# Patient Record
Sex: Female | Born: 1985 | Race: White | Marital: Married | Smoking: Never smoker
Health system: Southern US, Community
[De-identification: ages and names within clinical notes are randomized; demographics above are authoritative.]

## PROBLEM LIST (undated history)

## (undated) DIAGNOSIS — R0683 Snoring: Secondary | ICD-10-CM

## (undated) DIAGNOSIS — I10 Essential (primary) hypertension: Secondary | ICD-10-CM

## (undated) HISTORY — DX: Essential (primary) hypertension: I10

## (undated) HISTORY — PX: CHOLECYSTECTOMY: SHX55

## (undated) HISTORY — DX: Snoring: R06.83

---

## 2013-10-24 ENCOUNTER — Ambulatory Visit: Payer: Self-pay | Admitting: Physician Assistant

## 2013-10-24 ENCOUNTER — Ambulatory Visit (HOSPITAL_BASED_OUTPATIENT_CLINIC_OR_DEPARTMENT_OTHER)
Admission: RE | Admit: 2013-10-24 | Discharge: 2013-10-24 | Disposition: A | Discharge status: Home / Self Care | Payer: Self-pay | Source: Ambulatory Visit | Attending: Physician Assistant | Admitting: Physician Assistant

## 2013-10-24 ENCOUNTER — Encounter: Payer: Self-pay | Admitting: Physician Assistant

## 2013-10-24 ENCOUNTER — Ambulatory Visit (INDEPENDENT_AMBULATORY_CARE_PROVIDER_SITE_OTHER): Payer: Self-pay | Admitting: Physician Assistant

## 2013-10-24 VITALS — BP 123/73 | HR 95 | Ht 64.0 in | Wt 185.0 lb

## 2013-10-24 DIAGNOSIS — Z87442 Personal history of urinary calculi: Secondary | ICD-10-CM | POA: Insufficient documentation

## 2013-10-24 DIAGNOSIS — R1012 Left upper quadrant pain: Secondary | ICD-10-CM

## 2013-10-24 DIAGNOSIS — K760 Fatty (change of) liver, not elsewhere classified: Secondary | ICD-10-CM | POA: Insufficient documentation

## 2013-10-24 DIAGNOSIS — Z13220 Encounter for screening for lipoid disorders: Secondary | ICD-10-CM

## 2013-10-24 DIAGNOSIS — Z131 Encounter for screening for diabetes mellitus: Secondary | ICD-10-CM

## 2013-10-24 DIAGNOSIS — E781 Pure hyperglyceridemia: Secondary | ICD-10-CM

## 2013-10-24 DIAGNOSIS — Z9089 Acquired absence of other organs: Secondary | ICD-10-CM | POA: Insufficient documentation

## 2013-10-24 DIAGNOSIS — E669 Obesity, unspecified: Secondary | ICD-10-CM | POA: Insufficient documentation

## 2013-10-24 DIAGNOSIS — Z8742 Personal history of other diseases of the female genital tract: Secondary | ICD-10-CM | POA: Insufficient documentation

## 2013-10-24 LAB — COMPLETE METABOLIC PANEL WITH GFR
ALK PHOS: 61 U/L (ref 39–117)
ALT: 18 U/L (ref 0–35)
AST: 18 U/L (ref 0–37)
Albumin: 4.3 g/dL (ref 3.5–5.2)
BILIRUBIN TOTAL: 0.4 mg/dL (ref 0.2–1.2)
BUN: 7 mg/dL (ref 6–23)
CALCIUM: 9.2 mg/dL (ref 8.4–10.5)
CHLORIDE: 106 meq/L (ref 96–112)
CO2: 26 mEq/L (ref 19–32)
CREATININE: 0.75 mg/dL (ref 0.50–1.10)
GFR, Est African American: >89 mL/min
GFR, Est Non African American: >89 mL/min
Glucose, Bld: 91 mg/dL (ref 70–99)
Potassium: 4.4 mEq/L (ref 3.5–5.3)
Sodium: 138 mEq/L (ref 135–145)
Total Protein: 6.5 g/dL (ref 6.0–8.3)

## 2013-10-24 LAB — LIPID PANEL
CHOL/HDL RATIO: 2.6 ratio
Cholesterol: 141 mg/dL (ref 0–200)
HDL: 55 mg/dL (ref >39)
LDL CALC: 71 mg/dL (ref 0–99)
TRIGLYCERIDES: 75 mg/dL (ref <150)
VLDL: 15 mg/dL (ref 0–40)

## 2013-10-24 LAB — CBC WITH DIFFERENTIAL/PLATELET
BASOS PCT: 1 % (ref 0–1)
Basophils Absolute: 0 10*3/uL (ref 0.0–0.1)
EOS ABS: 0 10*3/uL (ref 0.0–0.7)
EOS PCT: 1 % (ref 0–5)
HEMATOCRIT: 38.5 % (ref 36.0–46.0)
Hemoglobin: 13.5 g/dL (ref 12.0–15.0)
Lymphocytes Relative: 36 % (ref 12–46)
Lymphs Abs: 1.7 10*3/uL (ref 0.7–4.0)
MCH: 30.7 pg (ref 26.0–34.0)
MCHC: 35.1 g/dL (ref 30.0–36.0)
MCV: 87.5 fL (ref 78.0–100.0)
MONO ABS: 0.5 10*3/uL (ref 0.1–1.0)
Monocytes Relative: 11 % (ref 3–12)
Neutro Abs: 2.3 10*3/uL (ref 1.7–7.7)
Neutrophils Relative %: 51 % (ref 43–77)
Platelets: 277 10*3/uL (ref 150–400)
RBC: 4.4 MIL/uL (ref 3.87–5.11)
RDW: 13.2 % (ref 11.5–15.5)
WBC: 4.6 10*3/uL (ref 4.0–10.5)

## 2013-10-24 LAB — TSH: TSH: 0.749 u[IU]/mL (ref 0.350–4.500)

## 2013-10-24 LAB — AMYLASE: AMYLASE: 20 U/L (ref 0–105)

## 2013-10-24 LAB — LIPASE: LIPASE: 33 U/L (ref 0–75)

## 2013-10-24 NOTE — Patient Instructions (Signed)
Will call with results

## 2013-10-24 NOTE — Progress Notes (Signed)
   Subjective:    Patient ID: Jackie Harrington, female    DOB: 15-Jan-1986, 28 y.o.   MRN: [id number]  HPI Pt is a 28 yo female who presents to the clinic to establish care.   .. Active Ambulatory Problems    Diagnosis Date Noted  . Hypertriglyceridemia 10/24/2013  . History of kidney stones 10/24/2013  . History of ovarian cyst 10/24/2013   Resolved Ambulatory Problems    Diagnosis Date Noted  . No Resolved Ambulatory Problems   No Additional Past Medical History   .Marland Kitchen Family History  Problem Relation Age of Onset  . Hypertension Father   . Cancer Maternal Grandmother     vulvar   .Marland Kitchen History   Social History  . Marital Status: Married    Spouse Name: N/A    Number of Children: N/A  . Years of Education: N/A   Occupational History  . Not on file.   Social History Main Topics  . Smoking status: Never Smoker   . Smokeless tobacco: Not on file  . Alcohol Use: Yes  . Drug Use: No  . Sexual Activity: Yes   Other Topics Concern  . Not on file   Social History Narrative  . No narrative on file   Pt is concerned with a left upper quadrant pain that has been off and on for one year. Starts under left rib and radiates around to back. Seems to become more noticeable and happening more during the day. It was orginally only at night and first thing in the morning. No acid reflux symptoms. Eating does not bother symptoms. No fever, chills, nausea or vomiting. No abdominal pain eleswhere. No bowel changes or blood/black tarry stools. Nothing except position changes makes better. Describes as dull ache with occasional sharp pains. Pt has had a cholecystectomy in 2011.    Review of Systems  All other systems reviewed and are negative.      Objective:   Physical Exam  Constitutional: She is oriented to person, place, and time. She appears well-developed and well-nourished.  HENT:  Head: Normocephalic and atraumatic.  Cardiovascular: Normal rate, regular rhythm and normal heart  sounds.   Pulmonary/Chest: Effort normal and breath sounds normal. She has no wheezes.  Abdominal: Soft. Bowel sounds are normal. She exhibits no distension and no mass. There is no tenderness. There is no rebound and no guarding.  Neurological: She is alert and oriented to person, place, and time.  Skin: Skin is dry.  Psychiatric: She has a normal mood and affect. Her behavior is normal.          Assessment & Plan:  LUQ pain- ordered abdominal ultrasound. Labs ordered to evaluate pancreatic enzymes/liver enzymes. Unclear etiology at this point. Symptoms do not sound like acid reflux. Will wait for results to continue work up.   hypertriglyceridemia - fasting labs ordered today.   Obesity- currently not doing anything to loose weight. Will check tSH. Will discuss at CPE.    Flu shot declined today.  Fasting labs ordered.   Discussed with pt need for CPE.

## 2016-02-02 IMAGING — US US ABDOMEN COMPLETE
1 series · 14 of 25 positions shown · non-contrast
Comparison: None.

CLINICAL DATA: 28-year-old female with left upper quadrant pain.
History of right side renal stone, cholecystectomy. Initial
encounter.

EXAM:
ULTRASOUND ABDOMEN COMPLETE

[Series 1: us abdomen complete · 0.25mm/px · 14 of 57 slices shown]
[im 1/57]
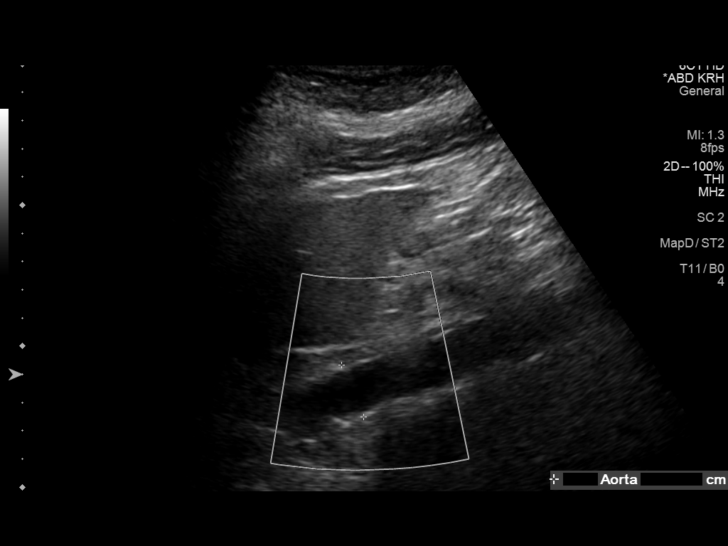
[im 5/57]
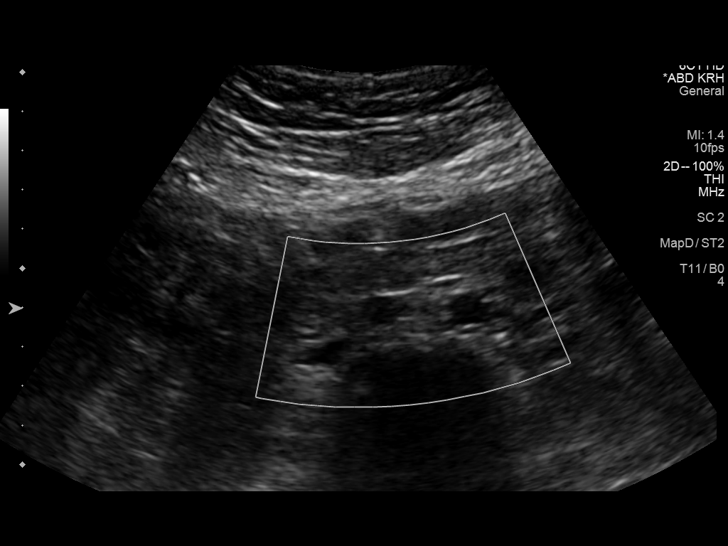
[im 10/57]
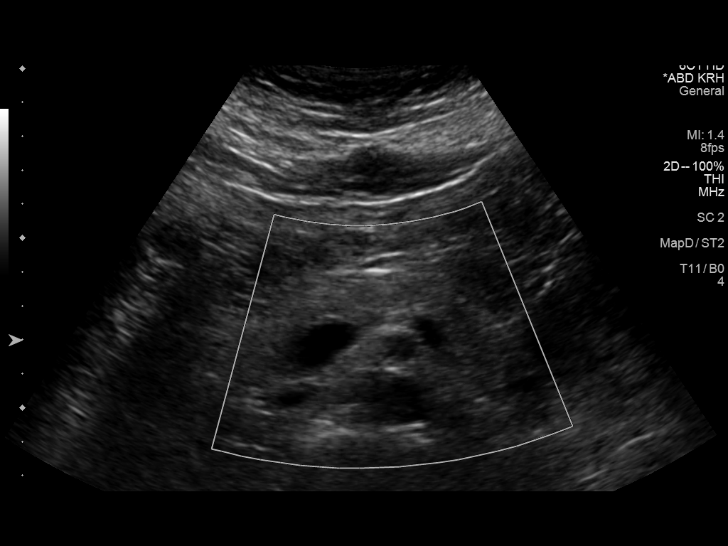
[im 15/57]
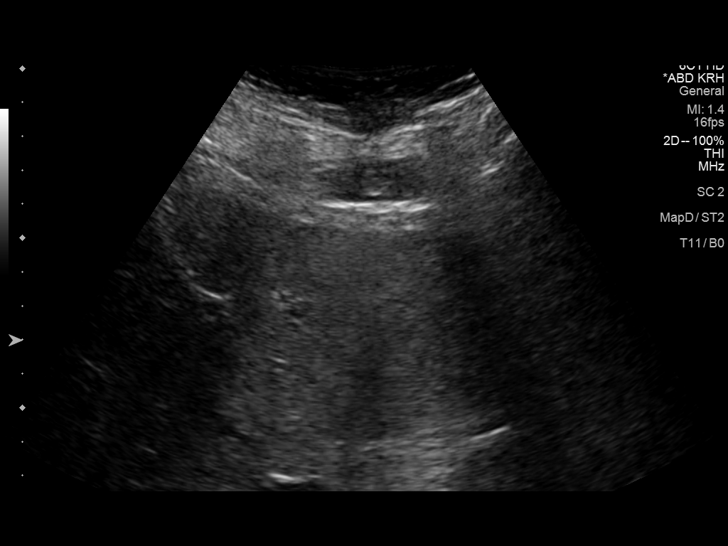
[im 19/57]
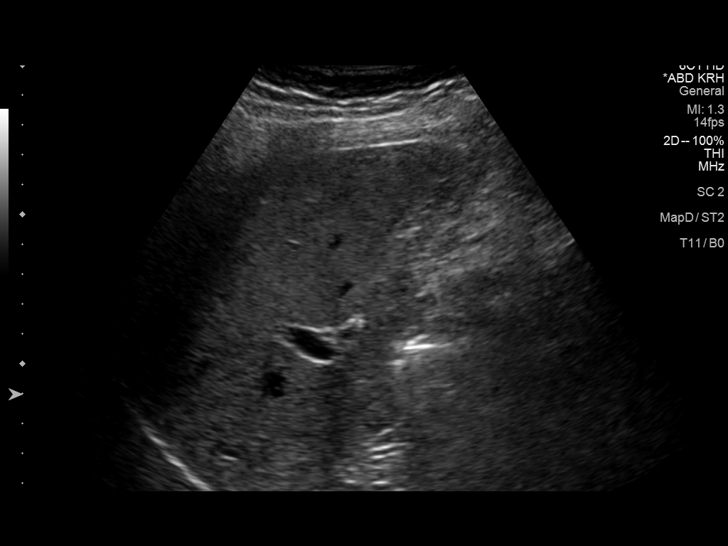
[im 22/57]
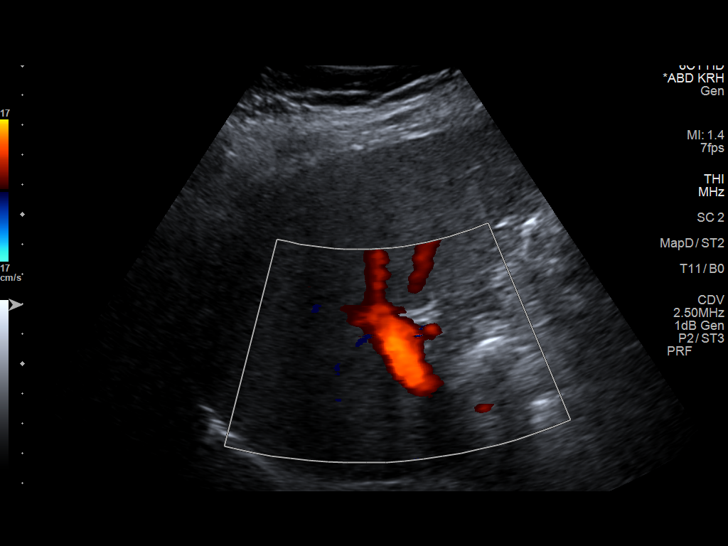
[im 26/57]
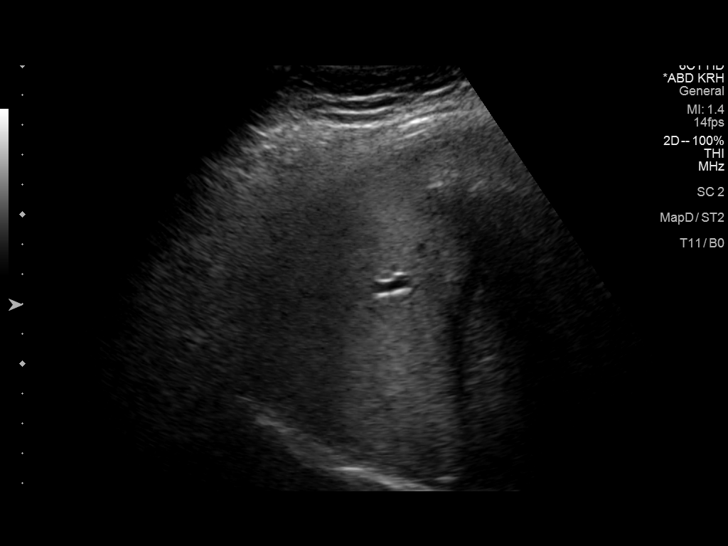
[im 31/57]
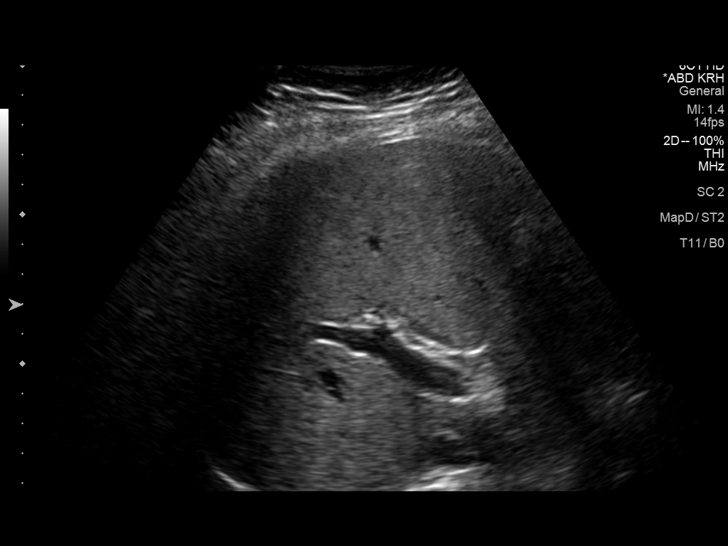
[im 36/57]
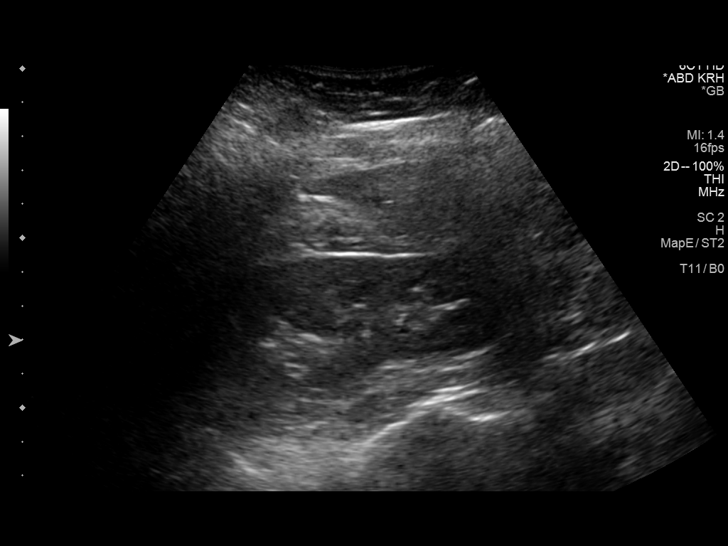
[im 38/57]
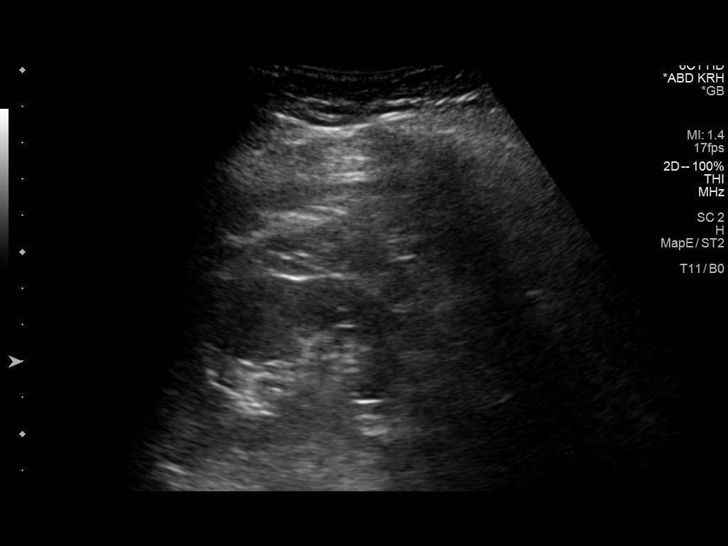
[im 43/57]
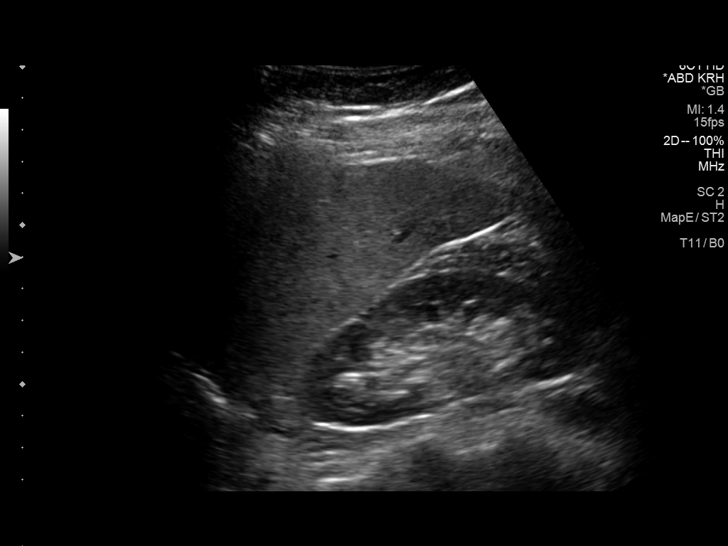
[im 47/57]
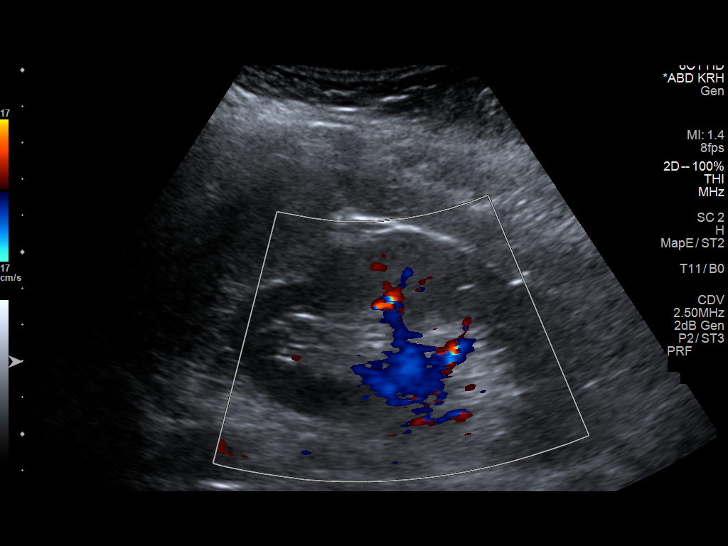
[im 52/57]
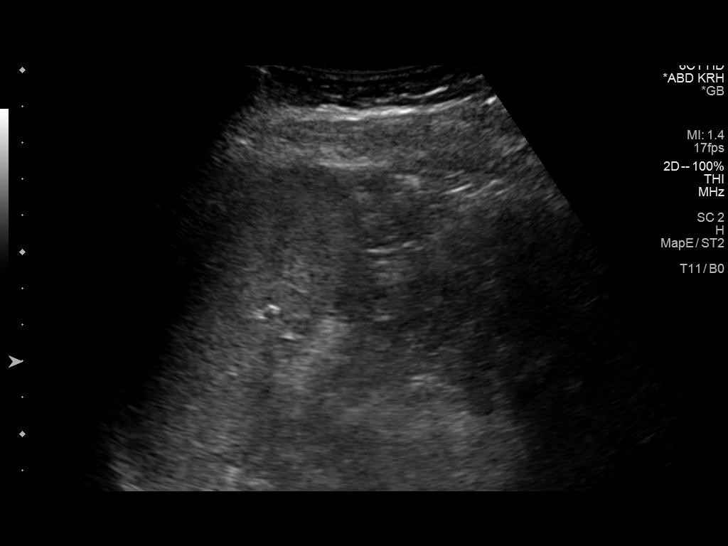
[im 57/57]
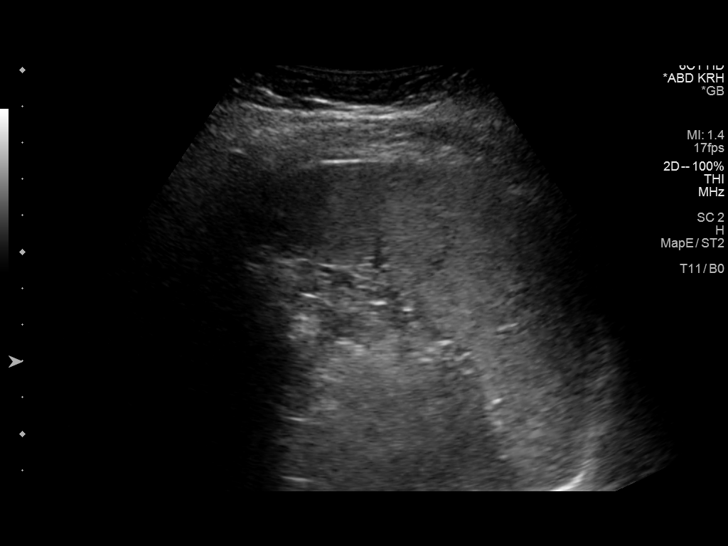

[14 of 25 positions shown; findings below may reference images not displayed]

FINDINGS: Gallbladder:

Surgically absent

Common bile duct:

Diameter: 2 mm, normal

Liver:

Mildly to moderately increased echogenicity diffusely. No
intrahepatic biliary ductal dilatation. No discrete liver lesion.

IVC:

Incompletely visualized due to overlying bowel gas, visualized
portions within normal limits.

Pancreas:

Incompletely visualized due to overlying bowel gas, visualized
portions within normal limits.

Spleen:

Size and appearance within normal limits.

Right Kidney:

Length: 10.4 cm. Echogenicity within normal limits. No mass or
hydronephrosis visualized.

Left Kidney:

Length: 10.1 cm. Echogenicity within normal limits. No mass or
hydronephrosis visualized.

Abdominal aorta:

Incompletely visualized due to overlying bowel gas, visualized
portions within normal limits.

Other findings:

None.
IMPRESSION: Negative except for hepatic steatosis. Surgically absent
gallbladder.

## 2023-03-30 ENCOUNTER — Encounter: Payer: Self-pay | Admitting: Obstetrics and Gynecology

## 2023-03-30 ENCOUNTER — Ambulatory Visit: Payer: 59 | Admitting: Obstetrics and Gynecology

## 2023-03-30 ENCOUNTER — Ambulatory Visit (INDEPENDENT_AMBULATORY_CARE_PROVIDER_SITE_OTHER): Payer: 59 | Admitting: Obstetrics and Gynecology

## 2023-03-30 VITALS — BP 150/109 | HR 93 | Ht 64.0 in | Wt 200.0 lb

## 2023-03-30 DIAGNOSIS — N812 Incomplete uterovaginal prolapse: Secondary | ICD-10-CM | POA: Diagnosis not present

## 2023-03-30 DIAGNOSIS — N3281 Overactive bladder: Secondary | ICD-10-CM | POA: Insufficient documentation

## 2023-03-30 DIAGNOSIS — R350 Frequency of micturition: Secondary | ICD-10-CM | POA: Diagnosis not present

## 2023-03-30 DIAGNOSIS — R159 Full incontinence of feces: Secondary | ICD-10-CM | POA: Diagnosis not present

## 2023-03-30 DIAGNOSIS — I10 Essential (primary) hypertension: Secondary | ICD-10-CM | POA: Insufficient documentation

## 2023-03-30 LAB — POCT URINALYSIS DIPSTICK
Bilirubin, UA: NEGATIVE
Blood, UA: NEGATIVE
Glucose, UA: NEGATIVE
Ketones, UA: NEGATIVE
Leukocytes, UA: NEGATIVE
Nitrite, UA: NEGATIVE
Protein, UA: NEGATIVE
Spec Grav, UA: 1.025 (ref 1.010–1.025)
Urobilinogen, UA: 0.2 U/dL
pH, UA: 6 (ref 5.0–8.0)

## 2023-03-30 NOTE — Patient Instructions (Addendum)
 Accidental Bowel Leakage: Our goal is to achieve formed bowel movements daily or every-other-day without leakage.  You may need to try different combinations of the following options to find what works best for you.  Some management options include: Dietary changes (more leafy greens, vegetables and fruits; less processed foods) Fiber supplementation (Metamucil or something with psyllium as active ingredient)- take daily  Over-the-counter imodium (tablets or liquid) to help solidify the stool and prevent leakage of stool.   URODYNAMICS (UDS) TEST INFORMATION  IMPORTANT: Please try to arrive with a comfortably full bladder!    What is UDS? Urodynamics is a bladder test used to evaluate how your bladder and urethra (tube you urinate out of) work to help find out the cause of your bladder symptoms and evaluate your bladder function in order to make the best treatment plan for you.   What to expect? A nurse will perform the test and will be with you during the entire exam. First we will have to empty your bladder on a special toilet.  After you have emptied your bladder, very small catheters (plastic tubing) will be placed into your bladder and into your vagina (or rectum). These special small catheters measure pressure to help measure your bladder function.  Your bladder will be gently filled with water and you will be asked to cough and strain at several different points during the test.   You will then be asked to empty your bladder in the special toilet with the catheters in place. Most patients can urinate (pee) easily with the catheters in place since the catheters are so small. In total this procedure lasts about 45 minutes to 1 hour.  After your test is completed, you will return (or possibly be seen the same day) to review the results, talk about treatment options and make a plan moving forward.

## 2023-03-30 NOTE — Assessment & Plan Note (Signed)
-   We discussed the symptoms of overactive bladder (OAB), which include urinary urgency, urinary frequency, nocturia, with or without urge incontinence.  While we do not know the exact etiology of OAB, several treatment options exist. We discussed management including behavioral therapy (decreasing bladder irritants, urge suppression strategies, timed voids, bladder retraining), physical therapy, medication.  - She is most bothered by nighttime urination. We discussed this may also be due to undiagnosed sleep apnea.  - Will have her undergo urodynamic testing. Can consider medication for symptoms. If not seeing improvement, then should consider a sleep study.

## 2023-03-30 NOTE — Progress Notes (Signed)
 New Patient Evaluation and Consultation  Referring Provider: No ref. provider found PCP: Patient, No Pcp Per Date of Service: 03/30/2023  SUBJECTIVE Chief Complaint: New Patient (Initial Visit) (Jackie Harrington is a 38 y.o. female here for a consult for prolapse.)  History of Present Illness: Jackie Harrington is a 38 y.o. White or Caucasian female presenting for evaluation of prolapse.   Review of records significant for: Had forceps assisted vaginal delivery 03/2020 with 3c laceration. Seen by Sammy Coffee and advised to wait at least a year for surgical intervention.   Urinary Symptoms: Leaks urine with going from sitting to standing, with movement to the bathroom, and while asleep Every 2-3 months, will wet the bed.  Gets urgency with washing dishes.  Leaks occasionally.  Pad use: 2 liners/ mini-pads per day.   Patient is bothered by UI symptoms. Started pelvic PT and that has helped some. Has seen two separate PT  Day time voids- every few hours.  Nocturia: 2-4 times per night to void. Voiding dysfunction:  empties bladder well.  Patient does not use a catheter to empty bladder.  When urinating, patient feels dribbling after finishing Drinks: 20 oz bottle Dr Nunzio, 6 cups water per day. Has tried to avoid drinking before bed. Does not drink alcohol in evenings. She has a history of snoring.   UTIs:  0  UTI's in the last year.   Denies history of blood in urine. Has history of kidney stones in 2008    Pelvic Organ Prolapse Symptoms:                  Patient Admits to a feeling of a bulge the vaginal area. It has been present since her delivery in 2022.  Patient Admits to seeing a bulge.  This bulge is bothersome.   Bowel Symptom: Bowel movements: 2 time(s) per day Stool consistency: soft  Straining: no.  Splinting: no.  Incomplete evacuation: no.  Patient Admits to accidental bowel leakage / fecal incontinence  Occurs: after bowel movements, more if she has diarrhea. Also c/o  gas leakage  Consistency with leakage: liquid Bowel regimen: none Did not see improvement with bowel leakage with PT.   Sexual Function Sexually active: yes.   Pain with sex: has discomfort due to prolapse  Pelvic Pain Denies pelvic pain    Past Medical History:  Past Medical History:  Diagnosis Date   Hypertension    Snoring      Past Surgical History:   Past Surgical History:  Procedure Laterality Date   CHOLECYSTECTOMY       Past OB/GYN History: OB History  Gravida Para Term Preterm AB Living  1 1    1   SAB IAB Ectopic Multiple Live Births          # Outcome Date GA Lbr Len/2nd Weight Sex Type Anes PTL Lv  1 Para      Vag-Forceps       Patient's last menstrual period was 03/23/2023. Contraception: condoms. Last pap smear was 2021- negative.   Medications: Patient has a current medication list which includes the following prescription(s): multivitamin.   Allergies: Patient is allergic to penicillins.   Social History:  Social History   Tobacco Use   Smoking status: Never  Substance Use Topics   Alcohol use: Yes   Drug use: No    Relationship status: married Patient lives with husband and son.   Patient is not employed. Regular exercise: Yes:   History of abuse: No  Family History:   Family History  Problem Relation Age of Onset   Hypertension Father    Cancer Maternal Grandmother        vulvar     Review of Systems: Review of Systems  Constitutional:  Negative for fever, malaise/fatigue and weight loss.  Respiratory:  Negative for cough, shortness of breath and wheezing.   Cardiovascular:  Negative for chest pain, palpitations and leg swelling.  Gastrointestinal:  Negative for abdominal pain and blood in stool.  Genitourinary:  Negative for dysuria.  Musculoskeletal:  Negative for myalgias.  Skin:  Negative for rash.  Neurological:  Negative for dizziness and headaches.  Endo/Heme/Allergies:  Does not bruise/bleed easily.   Psychiatric/Behavioral:  Negative for depression. The patient is not nervous/anxious.      OBJECTIVE Physical Exam: Vitals:   03/30/23 1007  BP: (!) 150/109  Pulse: 93  Weight: 200 lb (90.7 kg)  Height: 5' 4 (1.626 m)    Physical Exam Vitals reviewed. Exam conducted with a chaperone present.  Constitutional:      General: She is not in acute distress. Pulmonary:     Effort: Pulmonary effort is normal.  Abdominal:     General: There is no distension.     Palpations: Abdomen is soft.     Tenderness: There is no abdominal tenderness. There is no rebound.  Musculoskeletal:        General: No swelling. Normal range of motion.  Skin:    General: Skin is warm and dry.     Findings: No rash.  Neurological:     Mental Status: She is alert and oriented to person, place, and time.  Psychiatric:        Mood and Affect: Mood normal.        Behavior: Behavior normal.      GU / Detailed Urogynecologic Evaluation:  Pelvic Exam: Normal external female genitalia; Bartholin's and Skene's glands normal in appearance; urethral meatus normal in appearance, no urethral masses or discharge.   CST: negative  Speculum exam reveals normal vaginal mucosa without atrophy. Cervix normal appearance. Uterus normal single, nontender. Adnexa no mass, fullness, tenderness.     Pelvic floor strength I/V, puborectalis II/V external anal sphincter I/V  Pelvic floor musculature: Right levator non-tender, Right obturator non-tender, Left levator non-tender, Left obturator non-tender  POP-Q:   POP-Q  0                                            Aa   0                                           Ba  0                                              C   3.5                                            Gh  5  Pb  8.5                                            tvl   -1.5                                            Ap  -1.5                                             Bp  -6                                              D      Rectal Exam:  Normal sphincter tone, no dovetail sign, small distal rectocele, enterocoele not present, no rectal masses, no sign of dyssynergia when asking the patient to bear down.  Post-Void Residual (PVR) by Bladder Scan: In order to evaluate bladder emptying, we discussed obtaining a postvoid residual and patient agreed to this procedure.  Procedure: The ultrasound unit was placed on the patient's abdomen in the suprapubic region after the patient had voided.    Post Void Residual - 03/30/23 1020       Post Void Residual   Post Void Residual 19 mL              Laboratory Results: Lab Results  Component Value Date   COLORU Dark Yellow 03/30/2023   CLARITYU Clear 03/30/2023   GLUCOSEUR Negative 03/30/2023   BILIRUBINUR Negative 03/30/2023   KETONESU Negative 03/30/2023   SPECGRAV 1.025 03/30/2023   RBCUR Negative 03/30/2023   PHUR 6.0 03/30/2023   PROTEINUR Negative 03/30/2023   UROBILINOGEN 0.2 03/30/2023   LEUKOCYTESUR Negative 03/30/2023    Lab Results  Component Value Date   CREATININE 0.75 10/24/2013    No results found for: HGBA1C  Lab Results  Component Value Date   HGB 13.5 10/24/2013     ASSESSMENT AND PLAN Ms. Larrick is a 38 y.o. with:  1. Uterovaginal prolapse, incomplete   2. Overactive bladder   3. Urinary frequency   4. Incontinence of feces, unspecified fecal incontinence type   5. Hypertension, unspecified type     Uterovaginal prolapse, incomplete Assessment & Plan: Stage II anterior, Stage I posterior, Stage II apical prolapse - For treatment of pelvic organ prolapse, we discussed options for management including expectant management, conservative management, and surgical management, such as Kegels, a pessary, pelvic floor physical therapy, and specific surgical procedures. - We discussed two options for prolapse repair:  1) vaginal repair without mesh -  Pros - safer, no mesh complications - Cons - not as strong as mesh repair, higher risk of recurrence  2) laparoscopic repair with mesh - Pros - stronger, better long-term success - Cons - risks of mesh implant (erosion into vagina or bladder, adhering to the rectum, pain) - these risks are lower than with a vaginal mesh but still exist - Handouts were provided on both options but she is leaning toward native tissue repair.  - She asked about  uterine preservation. We discussed this is an option with similar short term options, but we do not have long term data.     Overactive bladder Assessment & Plan: - We discussed the symptoms of overactive bladder (OAB), which include urinary urgency, urinary frequency, nocturia, with or without urge incontinence.  While we do not know the exact etiology of OAB, several treatment options exist. We discussed management including behavioral therapy (decreasing bladder irritants, urge suppression strategies, timed voids, bladder retraining), physical therapy, medication.  - She is most bothered by nighttime urination. We discussed this may also be due to undiagnosed sleep apnea.  - Will have her undergo urodynamic testing. Can consider medication for symptoms. If not seeing improvement, then should consider a sleep study.      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 We discussed the option of anal sphincteroplasty at the time of prolapse repair.  - Also recommended daily fiber supplementation with metamucil for stool bulking   Hypertension, unspecified type Assessment & Plan: - Hx of HTN throughout pregnancy and PreEclampsia. BP high today but she is not established with a PCP. Referral made to primary care in [place] for management  Orders: -     Ambulatory Referral to Primary Care  Return for urodynamic  testing   Rosaline LOISE Caper, MD

## 2023-03-30 NOTE — Assessment & Plan Note (Signed)
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 We discussed the option of anal sphincteroplasty at the time of prolapse repair.  - Also recommended daily fiber supplementation with metamucil for stool bulking

## 2023-03-30 NOTE — Assessment & Plan Note (Signed)
 Stage II anterior, Stage I posterior, Stage II apical prolapse - For treatment of pelvic organ prolapse, we discussed options for management including expectant management, conservative management, and surgical management, such as Kegels, a pessary, pelvic floor physical therapy, and specific surgical procedures. - We discussed two options for prolapse repair:  1) vaginal repair without mesh - Pros - safer, no mesh complications - Cons - not as strong as mesh repair, higher risk of recurrence  2) laparoscopic repair with mesh - Pros - stronger, better long-term success - Cons - risks of mesh implant (erosion into vagina or bladder, adhering to the rectum, pain) - these risks are lower than with a vaginal mesh but still exist - Handouts were provided on both options but she is leaning toward native tissue repair.  - She asked about uterine preservation. We discussed this is an option with similar short term options, but we do not have long term data.

## 2023-03-30 NOTE — Assessment & Plan Note (Signed)
-   Hx of HTN throughout pregnancy and PreEclampsia. BP high today but she is not established with a PCP. Referral made to primary care in [place] for management

## 2023-04-12 ENCOUNTER — Encounter: Payer: 59 | Admitting: Obstetrics and Gynecology

## 2023-04-13 ENCOUNTER — Ambulatory Visit: Payer: Self-pay | Admitting: Obstetrics and Gynecology

## 2023-05-10 ENCOUNTER — Ambulatory Visit (INDEPENDENT_AMBULATORY_CARE_PROVIDER_SITE_OTHER): Payer: 59 | Admitting: Obstetrics and Gynecology

## 2023-05-10 ENCOUNTER — Encounter: Payer: Self-pay | Admitting: Obstetrics and Gynecology

## 2023-05-10 VITALS — BP 138/88

## 2023-05-10 DIAGNOSIS — N393 Stress incontinence (female) (male): Secondary | ICD-10-CM

## 2023-05-10 DIAGNOSIS — N819 Female genital prolapse, unspecified: Secondary | ICD-10-CM

## 2023-05-10 DIAGNOSIS — N3281 Overactive bladder: Secondary | ICD-10-CM | POA: Diagnosis not present

## 2023-05-10 DIAGNOSIS — N812 Incomplete uterovaginal prolapse: Secondary | ICD-10-CM

## 2023-05-10 DIAGNOSIS — R350 Frequency of micturition: Secondary | ICD-10-CM

## 2023-05-10 DIAGNOSIS — R159 Full incontinence of feces: Secondary | ICD-10-CM

## 2023-05-10 LAB — POCT URINALYSIS DIP (CLINITEK)
Bilirubin, UA: NEGATIVE
Blood, UA: NEGATIVE
Glucose, UA: NEGATIVE mg/dL
Ketones, POC UA: NEGATIVE mg/dL
Leukocytes, UA: NEGATIVE
Nitrite, UA: NEGATIVE
POC PROTEIN,UA: NEGATIVE
Spec Grav, UA: 1.015 (ref 1.010–1.025)
Urobilinogen, UA: 0.2 U/dL
pH, UA: 7.5 (ref 5.0–8.0)

## 2023-05-10 NOTE — Patient Instructions (Signed)
 Pumpkin seed extract can be helpful for overactive bladder spasms. Up to 5gms a day can be helpful for OAB symptoms.

## 2023-05-10 NOTE — Progress Notes (Signed)
 [facility] Urogynecology Urodynamics Procedure  Referring Physician: No ref. provider found Date of Procedure: 05/10/2023  Jackie Harrington is a 38 y.o. female who presents for urodynamic evaluation. 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  Vital Signs: BP 138/88 (BP Location: Right Arm, Patient Position: Sitting, Cuff Size: Normal)   Laboratory Results: A catheterized urine specimen revealed:  POC urine:  Lab Results  Component Value Date   COLORU colorless (A) 05/10/2023   CLARITYU clear 05/10/2023   GLUCOSEUR negative 05/10/2023   BILIRUBINUR negative 05/10/2023   KETONESU Negative 03/30/2023   SPECGRAV 1.015 05/10/2023   RBCUR negative 05/10/2023   PHUR 7.5 05/10/2023   PROTEINUR Negative 03/30/2023   UROBILINOGEN 0.2 05/10/2023   LEUKOCYTESUR Negative 05/10/2023      Voiding Diary:   Procedure Timeout:  The correct patient was verified and the correct procedure was verified. The patient was in the correct position and safety precautions were reviewed based on at the patient's history.  Urodynamic Procedure A 16F dual lumen urodynamics catheter was placed under sterile conditions into the patient's bladder. A 16F catheter was placed into the rectum in order to measure abdominal pressure. EMG patches were placed in the appropriate position.  All connections were confirmed and calibrations/adjusted made. Saline was instilled into the bladder through the dual lumen catheters.  Cough/valsalva pressures were measured periodically during filling.  Patient was allowed to void.  The bladder was then emptied of its residual.  UROFLOW: Revealed a Qmax of 20.3 mL/sec.  She voided 134 mL and had a residual of 75 mL.  It was a normal pattern and represented normal habits though interpretation limited due to low voided volume.  CMG: This was performed with sterile water in the sitting position at a fill rate of 30 mL/min.    First sensation of fullness was 72 mLs,   First urge was 77 mLs,  Strong urge was 129 mLs and  Capacity was 425 mLs  Stress incontinence was demonstrated Highest positive Barrier CLPP was 151 cmH20 at 130 ml. Highest positive Barrier VLPP was 117 cmH20 at 130 ml.  Detrusor function was overactive, with phasic contractions seen.  The first occurred at 30 mL to 4 cm of water and was associated with urge.  Compliance:  Normal . End fill detrusor pressure was 3.5cmH20.  Calculated compliance was 121.37mL/cmH20  UPP: MUCP with barrier reduction was 95 cm of water.    MICTURITION STUDY: Voiding was performed with reduction using scopettes in the sitting position.  Pdet at Qmax was 0 cm of water.  Qmax was 34 mL/sec.  It was a normal pattern.  She voided 395 mL and had a residual of 30 mL.  It was a volitional void, sustained detrusor contraction was present  at the end of the initial void. Abdominal straining was present  EMG: This was performed with patches.  She had voluntary contractions, recruitment with fill was present and urethral sphincter was not relaxed with void.  The details of the procedure with the study tracings have been scanned into EPIC.   Urodynamic Impression:  1. Sensation was increased; capacity was normal 2. Stress Incontinence was demonstrated at normal pressures; 3. Detrusor Overactivity was demonstrated without leakage. 4. Emptying was dysfunctional with a normal PVR, a sustained detrusor contraction present after the initial void,  abdominal straining present, dyssynergic urethral sphincter activity on EMG.  Plan: -Patient's goals are to reduce her symptoms occurring from her rectocele as well as determine the root cause of  her intermittent loss of bladder function while sleeping.  -Patient reported that cyclically every 2-3 months she will be in a deep sleep and lose complete control of her bladder while asleep. We discussed this could be related to OAB and she could be having a terminal DO during deep  sleep. We discussed incorporating pumpkin seed extract up to 5gm daily to see if this is helpful.  -For patient's prolapse, she wants the least amount of surgical intervention. We discussed this would most likely be a hysteropexy with anterior and posterior repair. We discussed there is a 20-30% risk of reoccurrence with vaginal native tissue repair.  -Patient is unsure what surgical route she wants to go and reported she is seeing a second opinion to further discuss options. We discussed that this is reasonable as surgery is always a big decision. We also discussed the option of a pessary and I showed her some of the pessaries we have. Patient will consider her options and call to let me know if she would like to move forward with pessary or surgical planning.

## 2023-05-18 ENCOUNTER — Ambulatory Visit: Payer: 59 | Admitting: Obstetrics and Gynecology

## 2023-05-25 ENCOUNTER — Encounter: Payer: Self-pay | Admitting: Obstetrics and Gynecology

## 2023-05-25 ENCOUNTER — Ambulatory Visit (INDEPENDENT_AMBULATORY_CARE_PROVIDER_SITE_OTHER): Admitting: Obstetrics and Gynecology

## 2023-05-25 VITALS — BP 141/95 | HR 87

## 2023-05-25 DIAGNOSIS — N812 Incomplete uterovaginal prolapse: Secondary | ICD-10-CM | POA: Diagnosis not present

## 2023-05-25 NOTE — Progress Notes (Signed)
 [facility]   Subjective:     Chief Complaint: Pessary Fitting  History of Present Illness: Jackie Harrington is a 38 y.o. female with stage II pelvic organ prolapse and stress incontinence who presents today for a pessary fitting.    Past Medical History: Patient  has a past medical history of Hypertension and Snoring.   Past Surgical History: She  has a past surgical history that includes Cholecystectomy.   Medications: She has a current medication list which includes the following prescription(s): multivitamin.   Allergies: Patient is allergic to penicillins.   Social History: Patient  reports that she has never smoked. She does not have any smokeless tobacco history on file. She reports current alcohol use. She reports that she does not use drugs.      Objective:    BP (!) 141/95   Pulse 87   LMP 05/11/2023  Gen: No apparent distress, A&O x 3. Pelvic Exam: Normal external female genitalia; Bartholin's and Skene's glands normal in appearance; urethral meatus normal in appearance, no urethral masses or discharge.   Attempted a #4 ring with support that did not provide enough support on the back wall Attempted a #4 incontinence dish that was expelled with valsalva Attempted a #3 cube that was expelled mildly during urination and valsalva on toilet.   A size #4 cube pessary was fitted. It was comfortable, stayed in place with valsalva and was an appropriate size on examination, with one finger fitting between the pessary and the vaginal walls. Patient was able to use the attached loop to remove and replace the pessary without issue. Patient was able to void and attempt defecation without difficulty.     Assessment/Plan:    Assessment: Jackie Harrington is a 38 y.o. with stage II pelvic organ prolapse who presents for a pessary fitting. Plan: She was fitted with a #4 cube pessary. She will remove at least weekly up to nightly . She will use lubricant.   Follow-up  in 5 weeks for a pessary check or sooner as needed.  All questions were answered.    Selmer Dominion, NP

## 2023-05-25 NOTE — Patient Instructions (Addendum)
 For the pessary: Take it out at least once a week You can use lubrication to help put it in.   Remember this pessary needs some suction so once you have it in place give a little cough or push to "lock" it in place.  If it falls out don't panic. It could be due to a harder bowel movement or increased pressure. You can wash it off and replace it or message me if you think you need a different size/shape.   Do not wear during your cycle  For cleaning I suggest cetaphil or dove soap and warm water.

## 2023-07-11 NOTE — Progress Notes (Signed)
 [facility] Return Visit  SUBJECTIVE  History of Present Illness: Jackie Harrington is a 38 y.o. female seen in follow-up for stage II pelvic organ prolapse, mixed urinary incontinence, nocturia, fecal incontinence with history of 3c perineal laceration. Plan at last visit was fiber supplementation, size 4 cube pessary trial.   Most bothered by vaginal bulge symptoms, followed by fecal smearing and urinary incontinence.  Used size 4 cube pessary for a few days, removed due to cramping sensation and painful with insertion and management. Continues to have fecal smearing and worsened urinary leakage. Symptoms started after forceps assisted vaginal delivery in 03/2020 with 3c perineal laceration. Reports initial severe episodes of fecal incontinence and difficulty with walking postpartum due to numbness in anterior thigh and inability to flex her R hip that improved after PT.  Per chart review: "S/p 3C vaginal laceration - area is feeling better every day She has noticed pins and needles just under knee cap and half way down calf Normal motor function, no foot drop She did have this sensation also on her upper thigh that has resolved."  Reports intermittent night time urinary leakage every 2-4 months. Declines overactive bladder medications due to intermittent symptoms. Desires to have uterine preservation, reports history of dermoid cyst. Failed #4 ring with support, #4 incontinence dish, #3 cube pessaries expelled during valsalva.  Drinks: 20 oz bottle Dr Kathlene Paradise, 6 cups water per day. Has tried to avoid drinking before bed. Does not drink alcohol in evenings.  Underwent pelvic floor PT in [place]  Urodynamic Impression 05/10/23:  1. Sensation was increased; capacity was normal at 2. Stress Incontinence was demonstrated at normal pressures; 3. Detrusor Overactivity was demonstrated without leakage. 4. Emptying was dysfunctional with a normal PVR, a sustained detrusor contraction  present after the initial void,  abdominal straining present, dyssynergic urethral sphincter activity on EMG. Qmax was 34 mL/sec     Past Medical History: Patient  has a past medical history of Hypertension and Snoring.   Past Surgical History: She  has a past surgical history that includes Cholecystectomy.   Medications: She has a current medication list which includes the following prescription(s): multivitamin.   Allergies: Patient is allergic to penicillins.   Social History: Patient  reports that she has never smoked. She does not have any smokeless tobacco history on file. She reports current alcohol use. She reports that she does not use drugs.     OBJECTIVE     Physical Exam: Vitals:   07/12/23 0853  BP: (!) 140/95  Pulse: 81   Physical Exam Constitutional:      General: She is not in acute distress.    Appearance: Normal appearance.  Genitourinary:     Bladder and urethral meatus normal.     No lesions in the vagina.     Genitourinary Comments: Thinning of EAS on DRE     Right Labia: No rash, tenderness, lesions, skin changes or Bartholin's cyst.    Left Labia: No tenderness, lesions, skin changes, Bartholin's cyst or rash.    Vaginal bleeding (on cycle with blood from cervical os) present.     No vaginal discharge, erythema, tenderness, ulceration or granulation tissue.     Anterior, posterior and apical vaginal prolapse present.     Right Adnexa: not tender, not full and no mass present.    Left Adnexa: not tender, not full and no mass present.    No cervical motion tenderness, discharge, friability, lesion, polyp or nabothian cyst.  Uterus is prolapsed.     Uterus is not enlarged, fixed, tender or irregular.     No uterine mass detected.    Urethral meatus caruncle not present.    Urethral hypermobility present.     No urethral prolapse, tenderness, mass, discharge or stress urinary incontinence with cough stress test present.     Bladder is not  tender, urgency on palpation not present and masses not present.      Levator ani not tender, obturator internus not tender, no asymmetrical contractions present and no pelvic spasms present.    Symmetrical pelvic sensation, anal wink present and BC reflex present. Rectum:     No rectal mass, tenderness, abnormal anal tone or rectovaginal septum nodularity.  Cardiovascular:     Rate and Rhythm: Normal rate.  Pulmonary:     Effort: Pulmonary effort is normal. No respiratory distress.  Neurological:     Mental Status: She is alert.  Vitals reviewed. Exam conducted with a chaperone present.      POP-Q  0                                            Aa   0                                           Ba  1                                              C   4                                            Gh  5                                            Pb  8                                            tvl   -2                                            Ap  -2                                            Bp  -2                                              D  ASSESSMENT AND PLAN    Ms. Luebke is a 38 y.o. with:  1. Uterovaginal prolapse, incomplete   2. Overactive bladder   3. Incontinence of feces, unspecified fecal incontinence type   4. Refusal of blood product   5. Nocturia   6. SUI (stress urinary incontinence, female)     Uterovaginal prolapse, incomplete Assessment & Plan: - size 4 cube pessary self removed due to cramping - Failed #4 ring with support, #4 incontinence dish, #3 cube pessaries expelled during valsalva. Pelvic floor PT x 2 - For treatment of pelvic organ prolapse, we discussed options for management including expectant management, conservative management, and surgical management, such as Kegels, a pessary, pelvic floor physical therapy, and specific surgical procedures. We discussed two options for prolapse repair:  1) vaginal repair without  mesh - Pros - safer, no mesh complications - Cons - not as strong as mesh repair, higher risk of recurrence  2) laparoscopic repair with mesh - Pros - stronger, better long-term success - Cons - risks of mesh implant (erosion into vagina or bladder, adhering to the rectum, pain) - these risks are lower than with a vaginal mesh but still exist - reviewed the importance distinction between anatomic vs. Functional success. Encouraged patient to resume pessary use to reassess clinical change prior to surgery, remove if she continues to experience discomfort after fiber supplementation - pt desires to avoid mesh use and hysterectomy, desires to proceed with vaginal hysteropexy   Overactive bladder Assessment & Plan: - UDS with OAB, intermittent night time leakage and urgency when she washes dishes - underwent pelvic floor PT x 2 without relief - We discussed the symptoms of overactive bladder (OAB), which include urinary urgency, urinary frequency, nocturia, with or without urge incontinence.  While we do not know the exact etiology of OAB, several treatment options exist. We discussed management including behavioral therapy (decreasing bladder irritants, urge suppression strategies, timed voids, bladder retraining), physical therapy, medication; for refractory cases posterior tibial nerve stimulation, sacral neuromodulation, and intravesical botulinum toxin injection.  For anticholinergic medications, we discussed the potential side effects of anticholinergics including dry eyes, dry mouth, constipation, cognitive impairment and urinary retention. For Beta-3 agonist medication, we discussed the potential side effect of elevated blood pressure which is more likely to occur in individuals with uncontrolled hypertension. - declines medications - discussed possible need for additional treatment postop   Incontinence of feces, unspecified fecal incontinence type Assessment & Plan: - started after 3c  perineal laceration in 03/2020, reduction of leakage since postpartum - s/p pelvic floor PT or pessary use without relief - Treatment options include anti-diarrhea medication (loperamide/ Imodium OTC or prescription lomotil), fiber supplements, physical therapy, and possible sacral neuromodulation or surgery.   - encouraged titration of fiber supplementation to reassess symptoms - avoid straining and encouraged squatting position for defecation - if no improvement with fiber supplementation alone, resume pessary use to assess change in bowel symptoms - explained that prolapse surgery along may not improve bowel symptoms if underlying etiology involves sphincter disruption or neuropathy.  - consider sphincteroplasty at the time of prolapse surgery if persistent symptoms   Refusal of blood product Assessment & Plan: - Jehovah's witness - discussed risk of blood loss (2-3% risk of transfusion with sacrospinous ligament suspension), prolonged hospitalization, ICU admission, and death in the rare event of severe bleeding intraoperatively - pt expresses understanding and desires to proceed.   Nocturia Assessment & Plan: - intermittent night time leakage every 2-4 months - denies relief  with caffeine reduction - avoid fluid intake 3 hours before bedtime - consider trial of OAB medications to reassess symptoms - if pt experiences snoring, consider workup for sleep apnea    SUI (stress urinary incontinence, female) Assessment & Plan: - SUI noted on urodynamics, worsened leakage with pessary use - For treatment of stress urinary incontinence,  non-surgical options include expectant management, weight loss, physical therapy, as well as a pessary.  Surgical options include a midurethral sling, Burch urethropexy, and transurethral injection of a bulking agent. - we discussed an office procedure with urethral bulking (Bulkamid). We discussed success rate of approximately 70-80% and possible need for  second injection. We reviewed that this is not a permanent procedure and the Bulkamid does dissolve over time. Risks reviewed including injury to bladder or urethra, UTI, urinary retention and hematuria.  - Sling: The effectiveness of a midurethral vaginal mesh sling is approximately 85%, and thus, there will be times when you may leak urine after surgery, especially if your bladder is full or if you have a strong cough. There is a balance between making the sling tight enough to treat your leakage but not too tight so that you have long-term difficulty emptying your bladder. A mesh sling will not directly treat overactive bladder/urge incontinence and may worsen it.  There is an FDA safety notification on vaginal mesh procedures for prolapse but NOT mesh slings. We have extensive experience and training with mesh placement and we have close postoperative follow up to identify any potential complications from mesh. It is important to realize that this mesh is a permanent implant that cannot be easily removed. There are rare risks of mesh exposure (2-4%), pain with intercourse (0-7%), and infection (<1%). The risk of mesh exposure if more likely in a woman with risks for poor healing (prior radiation, poorly controlled diabetes, or immunocompromised). The risk of new or worsened chronic pain after mesh implant is more common in women with baseline chronic pain and/or poorly controlled anxiety or depression. Approximately 2-4% of patients will experience longer-term post-operative voiding dysfunction that may require surgical revision of the sling. We also reviewed that postoperatively, her stream may not be as strong as before surgery.  - pt desires to avoid mesh use and considering urethral bulking with cystoscopy   Time spent: I spent 63 minutes dedicated to the care of this patient on the date of this encounter to include pre-visit review of records, face-to-face time with the patient discussing stage II  pelvic organ prolapse, stress urinary incontinence, nocturia, overactive bladder, fecal incontinence, refusal of blood products, and post visit documentation.   Darlene Ehlers, MD

## 2023-07-12 ENCOUNTER — Ambulatory Visit (INDEPENDENT_AMBULATORY_CARE_PROVIDER_SITE_OTHER): Admitting: Obstetrics

## 2023-07-12 ENCOUNTER — Encounter: Payer: Self-pay | Admitting: Obstetrics

## 2023-07-12 VITALS — BP 140/95 | HR 81

## 2023-07-12 DIAGNOSIS — N812 Incomplete uterovaginal prolapse: Secondary | ICD-10-CM

## 2023-07-12 DIAGNOSIS — N393 Stress incontinence (female) (male): Secondary | ICD-10-CM

## 2023-07-12 DIAGNOSIS — N3281 Overactive bladder: Secondary | ICD-10-CM | POA: Diagnosis not present

## 2023-07-12 DIAGNOSIS — Z789 Other specified health status: Secondary | ICD-10-CM

## 2023-07-12 DIAGNOSIS — R159 Full incontinence of feces: Secondary | ICD-10-CM

## 2023-07-12 DIAGNOSIS — R351 Nocturia: Secondary | ICD-10-CM | POA: Insufficient documentation

## 2023-07-12 NOTE — Patient Instructions (Signed)
 You have a stage 2 (out of 4) prolapse.  We discussed the fact that it is not life threatening but there are several treatment options. For treatment of pelvic organ prolapse, we discussed options for management including expectant management, conservative management, and surgical management, such as Kegels, a pessary, pelvic floor physical therapy, and specific surgical procedures.     We discussed two options for prolapse repair without mesh:  - vaginal repair without mesh - Pros - safer, no mesh complications - Cons - not as strong as mesh repair, higher risk of recurrence  We discussed office procedure with urethral bulking (Bulkamid). We discussed success rate of approximately 70-80% and possible need for second injection. We reviewed that this is not a permanent procedure and the Bulkamid does dissolve over time. Risks reviewed including injury to bladder or urethra, UTI, urinary retention and hematuria.   Women should try to eat at least 21 to 25 grams of fiber a day, while men should aim for 30 to 38 grams a day. You can add fiber to your diet with food or a fiber supplement such as psyllium (metamucil), benefiber, or fibercon.   Here's a look at how much dietary fiber is found in some common foods. When buying packaged foods, check the Nutrition Facts label for fiber content. It can vary among brands.  Fruits Serving size Total fiber (grams)*  Raspberries 1 cup 8.0  Pear 1 medium 5.5  Apple, with skin 1 medium 4.5  Banana 1 medium 3.0  Orange 1 medium 3.0  Strawberries 1 cup 3.0   Vegetables Serving size Total fiber (grams)*  Green peas, boiled 1 cup 9.0  Broccoli, boiled 1 cup chopped 5.0  Turnip greens, boiled 1 cup 5.0  Brussels sprouts, boiled 1 cup 4.0  Potato, with skin, baked 1 medium 4.0  Sweet corn, boiled 1 cup 3.5  Cauliflower, raw 1 cup chopped 2.0  Carrot, raw 1 medium 1.5   Grains Serving size Total fiber (grams)*  Spaghetti, whole-wheat, cooked 1 cup 6.0   Barley, pearled, cooked 1 cup 6.0  Bran flakes 3/4 cup 5.5  Quinoa, cooked 1 cup 5.0  Oat bran muffin 1 medium 5.0  Oatmeal, instant, cooked 1 cup 5.0  Popcorn, air-popped 3 cups 3.5  Brown rice, cooked 1 cup 3.5  Bread, whole-wheat 1 slice 2.0  Bread, rye 1 slice 2.0   Legumes, nuts and seeds Serving size Total fiber (grams)*  Split peas, boiled 1 cup 16.0  Lentils, boiled 1 cup 15.5  Black beans, boiled 1 cup 15.0  Baked beans, canned 1 cup 10.0  Chia seeds 1 ounce 10.0  Almonds 1 ounce (23 nuts) 3.5  Pistachios 1 ounce (49 nuts) 3.0  Sunflower kernels 1 ounce 3.0  *Rounded to nearest 0.5 gram. Source: [employer] for [employer]	[place]

## 2023-07-12 NOTE — Assessment & Plan Note (Addendum)
-   size 4 cube pessary self removed due to cramping - Failed #4 ring with support, #4 incontinence dish, #3 cube pessaries expelled during valsalva. Pelvic floor PT x 2 - For treatment of pelvic organ prolapse, we discussed options for management including expectant management, conservative management, and surgical management, such as Kegels, a pessary, pelvic floor physical therapy, and specific surgical procedures. We discussed two options for prolapse repair:  1) vaginal repair without mesh - Pros - safer, no mesh complications - Cons - not as strong as mesh repair, higher risk of recurrence  2) laparoscopic repair with mesh - Pros - stronger, better long-term success - Cons - risks of mesh implant (erosion into vagina or bladder, adhering to the rectum, pain) - these risks are lower than with a vaginal mesh but still exist - reviewed the importance distinction between anatomic vs. Functional success. Encouraged patient to resume pessary use to reassess clinical change prior to surgery, remove if she continues to experience discomfort after fiber supplementation - pt desires to avoid mesh use and hysterectomy, desires to proceed with vaginal hysteropexy

## 2023-07-12 NOTE — Assessment & Plan Note (Addendum)
-   intermittent night time leakage every 2-4 months - denies relief with caffeine reduction - avoid fluid intake 3 hours before bedtime - consider trial of OAB medications to reassess symptoms - if pt experiences snoring, consider workup for sleep apnea

## 2023-07-12 NOTE — Assessment & Plan Note (Addendum)
-   Jehovah's witness - discussed risk of blood loss (2-3% risk of transfusion with sacrospinous ligament suspension), prolonged hospitalization, ICU admission, and death in the rare event of severe bleeding intraoperatively - pt expresses understanding and desires to proceed.

## 2023-07-12 NOTE — Assessment & Plan Note (Addendum)
-   started after 3c perineal laceration in 03/2020, reduction of leakage since postpartum - s/p pelvic floor PT or pessary use without relief - Treatment options include anti-diarrhea medication (loperamide/ Imodium OTC or prescription lomotil), fiber supplements, physical therapy, and possible sacral neuromodulation or surgery.   - encouraged titration of fiber supplementation to reassess symptoms - avoid straining and encouraged squatting position for defecation - if no improvement with fiber supplementation alone, resume pessary use to assess change in bowel symptoms - explained that prolapse surgery along may not improve bowel symptoms if underlying etiology involves sphincter disruption or neuropathy.  - consider sphincteroplasty at the time of prolapse surgery if persistent symptoms

## 2023-07-12 NOTE — Assessment & Plan Note (Addendum)
-   UDS with OAB, intermittent night time leakage and urgency when she washes dishes - underwent pelvic floor PT x 2 without relief - We discussed the symptoms of overactive bladder (OAB), which include urinary urgency, urinary frequency, nocturia, with or without urge incontinence.  While we do not know the exact etiology of OAB, several treatment options exist. We discussed management including behavioral therapy (decreasing bladder irritants, urge suppression strategies, timed voids, bladder retraining), physical therapy, medication; for refractory cases posterior tibial nerve stimulation, sacral neuromodulation, and intravesical botulinum toxin injection.  For anticholinergic medications, we discussed the potential side effects of anticholinergics including dry eyes, dry mouth, constipation, cognitive impairment and urinary retention. For Beta-3 agonist medication, we discussed the potential side effect of elevated blood pressure which is more likely to occur in individuals with uncontrolled hypertension. - declines medications - discussed possible need for additional treatment postop

## 2023-07-12 NOTE — Assessment & Plan Note (Signed)
-   SUI noted on urodynamics, worsened leakage with pessary use - For treatment of stress urinary incontinence,  non-surgical options include expectant management, weight loss, physical therapy, as well as a pessary.  Surgical options include a midurethral sling, Burch urethropexy, and transurethral injection of a bulking agent. - we discussed an office procedure with urethral bulking (Bulkamid). We discussed success rate of approximately 70-80% and possible need for second injection. We reviewed that this is not a permanent procedure and the Bulkamid does dissolve over time. Risks reviewed including injury to bladder or urethra, UTI, urinary retention and hematuria.  - Sling: The effectiveness of a midurethral vaginal mesh sling is approximately 85%, and thus, there will be times when you may leak urine after surgery, especially if your bladder is full or if you have a strong cough. There is a balance between making the sling tight enough to treat your leakage but not too tight so that you have long-term difficulty emptying your bladder. A mesh sling will not directly treat overactive bladder/urge incontinence and may worsen it.  There is an FDA safety notification on vaginal mesh procedures for prolapse but NOT mesh slings. We have extensive experience and training with mesh placement and we have close postoperative follow up to identify any potential complications from mesh. It is important to realize that this mesh is a permanent implant that cannot be easily removed. There are rare risks of mesh exposure (2-4%), pain with intercourse (0-7%), and infection (<1%). The risk of mesh exposure if more likely in a woman with risks for poor healing (prior radiation, poorly controlled diabetes, or immunocompromised). The risk of new or worsened chronic pain after mesh implant is more common in women with baseline chronic pain and/or poorly controlled anxiety or depression. Approximately 2-4% of patients will  experience longer-term post-operative voiding dysfunction that may require surgical revision of the sling. We also reviewed that postoperatively, her stream may not be as strong as before surgery.  - pt desires to avoid mesh use and considering urethral bulking with cystoscopy

## 2024-03-05 ENCOUNTER — Encounter: Payer: Self-pay | Admitting: *Deleted
